# Patient Record
Sex: Female | Born: 1950 | Race: White | Hispanic: No | Marital: Married | Smoking: Never smoker
Health system: Southern US, Community
[De-identification: ages and names within clinical notes are randomized; demographics above are authoritative.]

## PROBLEM LIST (undated history)

## (undated) DIAGNOSIS — I10 Essential (primary) hypertension: Secondary | ICD-10-CM

---

## 1999-11-30 ENCOUNTER — Other Ambulatory Visit: Admission: RE | Admit: 1999-11-30 | Discharge: 1999-11-30 | Payer: Self-pay | Admitting: Obstetrics & Gynecology

## 2004-01-08 ENCOUNTER — Other Ambulatory Visit: Admission: RE | Admit: 2004-01-08 | Discharge: 2004-01-08 | Payer: Self-pay | Admitting: Obstetrics & Gynecology

## 2004-05-25 ENCOUNTER — Encounter: Admission: RE | Admit: 2004-05-25 | Discharge: 2004-05-25 | Payer: Self-pay | Admitting: Obstetrics & Gynecology

## 2012-04-30 ENCOUNTER — Emergency Department (HOSPITAL_COMMUNITY): Payer: BC Managed Care – PPO

## 2012-04-30 ENCOUNTER — Encounter (HOSPITAL_COMMUNITY): Payer: Self-pay

## 2012-04-30 ENCOUNTER — Emergency Department (HOSPITAL_COMMUNITY): Payer: BC Managed Care – PPO | Admitting: Anesthesiology

## 2012-04-30 ENCOUNTER — Encounter (HOSPITAL_COMMUNITY): Payer: Self-pay | Admitting: Anesthesiology

## 2012-04-30 ENCOUNTER — Encounter (HOSPITAL_COMMUNITY)
Admission: EM | Disposition: A | Discharge status: Home / Self Care | Payer: Self-pay | Source: Home / Self Care | Attending: Orthopedic Surgery

## 2012-04-30 ENCOUNTER — Ambulatory Visit (HOSPITAL_COMMUNITY)
Admission: EM | Admit: 2012-04-30 | Discharge: 2012-05-01 | Disposition: A | Discharge status: Home / Self Care | Payer: BC Managed Care – PPO | Attending: Orthopedic Surgery | Admitting: Orthopedic Surgery

## 2012-04-30 DIAGNOSIS — S82851A Displaced trimalleolar fracture of right lower leg, initial encounter for closed fracture: Secondary | ICD-10-CM

## 2012-04-30 DIAGNOSIS — S9306XA Dislocation of unspecified ankle joint, initial encounter: Secondary | ICD-10-CM | POA: Insufficient documentation

## 2012-04-30 DIAGNOSIS — R296 Repeated falls: Secondary | ICD-10-CM | POA: Insufficient documentation

## 2012-04-30 DIAGNOSIS — S82853A Displaced trimalleolar fracture of unspecified lower leg, initial encounter for closed fracture: Principal | ICD-10-CM | POA: Insufficient documentation

## 2012-04-30 DIAGNOSIS — I10 Essential (primary) hypertension: Secondary | ICD-10-CM | POA: Insufficient documentation

## 2012-04-30 HISTORY — PX: ORIF ANKLE FRACTURE: SHX5408

## 2012-04-30 HISTORY — DX: Essential (primary) hypertension: I10

## 2012-04-30 LAB — CBC WITH DIFFERENTIAL/PLATELET
Basophils Absolute: 0 10*3/uL (ref 0.0–0.1)
HCT: 42.8 % (ref 36.0–46.0)
MCH: 31.3 pg (ref 26.0–34.0)
MCHC: 35.3 g/dL (ref 30.0–36.0)
MCV: 88.8 fL (ref 78.0–100.0)
Monocytes Absolute: 0.4 10*3/uL (ref 0.1–1.0)
Monocytes Relative: 4 % (ref 3–12)
Neutro Abs: 8.6 10*3/uL — ABNORMAL HIGH (ref 1.7–7.7)
Neutrophils Relative %: 88 % — ABNORMAL HIGH (ref 43–77)
Platelets: 279 10*3/uL (ref 150–400)
RBC: 4.82 MIL/uL (ref 3.87–5.11)
RDW: 12.6 % (ref 11.5–15.5)

## 2012-04-30 LAB — BASIC METABOLIC PANEL
CO2: 30 mEq/L (ref 19–32)
GFR calc Af Amer: 87 mL/min — ABNORMAL LOW (ref >90)
GFR calc non Af Amer: 75 mL/min — ABNORMAL LOW (ref >90)

## 2012-04-30 SURGERY — OPEN REDUCTION INTERNAL FIXATION (ORIF) ANKLE FRACTURE
Anesthesia: General | Site: Ankle | Laterality: Right | Wound class: Clean

## 2012-04-30 MED ORDER — ONDANSETRON HCL 4 MG/2ML IJ SOLN
4.0000 mg | Freq: Once | INTRAMUSCULAR | Status: AC
  Administered 2012-04-30: 4 mg via INTRAVENOUS
  Filled 2012-04-30: qty 2

## 2012-04-30 MED ORDER — CEFAZOLIN SODIUM-DEXTROSE 2-3 GM-% IV SOLR
INTRAVENOUS | Status: AC
  Filled 2012-04-30: qty 50

## 2012-04-30 MED ORDER — MORPHINE SULFATE 4 MG/ML IJ SOLN
4.0000 mg | Freq: Once | INTRAMUSCULAR | Status: AC
  Administered 2012-04-30: 4 mg via INTRAVENOUS
  Filled 2012-04-30: qty 1

## 2012-04-30 MED ORDER — LACTATED RINGERS IV SOLN
INTRAVENOUS | Status: DC | PRN
  Administered 2012-04-30: 17:00:00 via INTRAVENOUS

## 2012-04-30 MED ORDER — ROCURONIUM BROMIDE 100 MG/10ML IV SOLN
INTRAVENOUS | Status: DC | PRN
  Administered 2012-04-30: 2 mg via INTRAVENOUS

## 2012-04-30 MED ORDER — ONDANSETRON HCL 4 MG PO TABS: 4.0000 mg | ORAL_TABLET | Freq: Four times a day (QID) | ORAL | Status: DC | PRN

## 2012-04-30 MED ORDER — EPHEDRINE SULFATE 50 MG/ML IJ SOLN
INTRAMUSCULAR | Status: DC | PRN
  Administered 2012-04-30: 5 mg via INTRAVENOUS

## 2012-04-30 MED ORDER — CEFAZOLIN SODIUM-DEXTROSE 2-3 GM-% IV SOLR
2.0000 g | Freq: Once | INTRAVENOUS | Status: AC
  Administered 2012-04-30: 2 g via INTRAVENOUS

## 2012-04-30 MED ORDER — SODIUM CHLORIDE 0.9 % IV SOLN
INTRAVENOUS | Status: DC
  Administered 2012-04-30: 21:00:00 via INTRAVENOUS

## 2012-04-30 MED ORDER — SUCCINYLCHOLINE CHLORIDE 20 MG/ML IJ SOLN
INTRAMUSCULAR | Status: DC | PRN
  Administered 2012-04-30: 100 mg via INTRAVENOUS

## 2012-04-30 MED ORDER — BUPIVACAINE HCL (PF) 0.5 % IJ SOLN
INTRAMUSCULAR | Status: AC
  Filled 2012-04-30: qty 30

## 2012-04-30 MED ORDER — PROPOFOL 10 MG/ML IV BOLUS
INTRAVENOUS | Status: DC | PRN
  Administered 2012-04-30: 40 mg via INTRAVENOUS
  Administered 2012-04-30: 20 mg via INTRAVENOUS
  Administered 2012-04-30: 130 mg via INTRAVENOUS

## 2012-04-30 MED ORDER — ACETAMINOPHEN 10 MG/ML IV SOLN
1000.0000 mg | Freq: Four times a day (QID) | INTRAVENOUS | Status: DC
  Administered 2012-04-30 – 2012-05-01 (×2): 1000 mg via INTRAVENOUS
  Filled 2012-04-30 (×3): qty 100

## 2012-04-30 MED ORDER — LIDOCAINE HCL (PF) 2 % IJ SOLN
INTRAMUSCULAR | Status: DC | PRN
  Administered 2012-04-30: 75 mg

## 2012-04-30 MED ORDER — ACETAMINOPHEN 10 MG/ML IV SOLN
INTRAVENOUS | Status: AC
  Filled 2012-04-30: qty 100

## 2012-04-30 MED ORDER — ACETAMINOPHEN 10 MG/ML IV SOLN
INTRAVENOUS | Status: DC | PRN
  Administered 2012-04-30: 1000 mg via INTRAVENOUS

## 2012-04-30 MED ORDER — ONDANSETRON HCL 4 MG/2ML IJ SOLN
4.0000 mg | Freq: Four times a day (QID) | INTRAMUSCULAR | Status: DC | PRN
  Administered 2012-04-30 – 2012-05-01 (×2): 4 mg via INTRAVENOUS
  Filled 2012-04-30 (×2): qty 2

## 2012-04-30 MED ORDER — ASPIRIN EC 325 MG PO TBEC: 325.0000 mg | DELAYED_RELEASE_TABLET | Freq: Every day | ORAL | Status: AC

## 2012-04-30 MED ORDER — ONDANSETRON HCL 4 MG/2ML IJ SOLN
INTRAMUSCULAR | Status: DC | PRN
  Administered 2012-04-30: 4 mg via INTRAVENOUS

## 2012-04-30 MED ORDER — FENTANYL CITRATE 0.05 MG/ML IJ SOLN: 25.0000 ug | INTRAMUSCULAR | Status: DC | PRN

## 2012-04-30 MED ORDER — CEFAZOLIN SODIUM 1-5 GM-% IV SOLN
1.0000 g | Freq: Four times a day (QID) | INTRAVENOUS | Status: AC
  Administered 2012-04-30 – 2012-05-01 (×3): 1 g via INTRAVENOUS
  Filled 2012-04-30 (×3): qty 50

## 2012-04-30 MED ORDER — OXYCODONE-ACETAMINOPHEN 5-325 MG PO TABS
1.0000 | ORAL_TABLET | ORAL | Status: DC | PRN
  Administered 2012-05-01: 1 via ORAL
  Filled 2012-04-30: qty 1

## 2012-04-30 MED ORDER — BUPIVACAINE HCL (PF) 0.5 % IJ SOLN
INTRAMUSCULAR | Status: DC | PRN
  Administered 2012-04-30: 30 mL

## 2012-04-30 MED ORDER — 0.9 % SODIUM CHLORIDE (POUR BTL) OPTIME
TOPICAL | Status: DC | PRN
  Administered 2012-04-30: 1000 mL

## 2012-04-30 MED ORDER — MIDAZOLAM HCL 5 MG/5ML IJ SOLN
INTRAMUSCULAR | Status: DC | PRN
  Administered 2012-04-30: 2 mg via INTRAVENOUS

## 2012-04-30 MED ORDER — HYDROMORPHONE HCL PF 1 MG/ML IJ SOLN
0.5000 mg | INTRAMUSCULAR | Status: DC | PRN
  Administered 2012-04-30 – 2012-05-01 (×2): 1 mg via INTRAVENOUS
  Filled 2012-04-30 (×2): qty 1

## 2012-04-30 MED ORDER — OXYCODONE-ACETAMINOPHEN 5-325 MG PO TABS: 1.0000 | ORAL_TABLET | Freq: Four times a day (QID) | ORAL | Status: AC | PRN

## 2012-04-30 MED ORDER — FENTANYL CITRATE 0.05 MG/ML IJ SOLN
INTRAMUSCULAR | Status: DC | PRN
  Administered 2012-04-30 (×2): 50 ug via INTRAVENOUS
  Administered 2012-04-30 (×2): 100 ug via INTRAVENOUS

## 2012-04-30 MED ORDER — HYDROCHLOROTHIAZIDE 25 MG PO TABS
25.0000 mg | ORAL_TABLET | Freq: Every day | ORAL | Status: DC
  Administered 2012-05-01: 25 mg via ORAL
  Filled 2012-04-30: qty 1

## 2012-04-30 MED ORDER — ASPIRIN EC 325 MG PO TBEC
325.0000 mg | DELAYED_RELEASE_TABLET | Freq: Every morning | ORAL | Status: DC
  Filled 2012-04-30: qty 1

## 2012-04-30 SURGICAL SUPPLY — 50 items
BAG SPEC THK2 15X12 ZIP CLS (MISCELLANEOUS) ×1
BAG ZIPLOCK 12X15 (MISCELLANEOUS) ×2 IMPLANT
BANDAGE ELASTIC 4 VELCRO ST LF (GAUZE/BANDAGES/DRESSINGS) ×1 IMPLANT
BANDAGE ELASTIC 6 VELCRO ST LF (GAUZE/BANDAGES/DRESSINGS) ×1 IMPLANT
BIT DRILL 2.5X2.75 QC CALB (BIT) ×1 IMPLANT
BIT DRILL 2.9 CANN QC NONSTRL (BIT) ×1 IMPLANT
BIT DRILL 3.5X5.5 QC CALB (BIT) ×1 IMPLANT
CLOTH BEACON ORANGE TIMEOUT ST (SAFETY) ×2 IMPLANT
COVER SURGICAL LIGHT HANDLE (MISCELLANEOUS) ×2 IMPLANT
CUFF TOURN SGL QUICK 34 (TOURNIQUET CUFF) ×2
CUFF TRNQT CYL 34X4X40X1 (TOURNIQUET CUFF) ×1 IMPLANT
DECANTER SPIKE VIAL GLASS SM (MISCELLANEOUS) ×2 IMPLANT
DRAPE C-ARM 42X72 X-RAY (DRAPES) ×2 IMPLANT
DRAPE U-SHAPE 47X51 STRL (DRAPES) ×2 IMPLANT
DRSG ADAPTIC 3X8 NADH LF (GAUZE/BANDAGES/DRESSINGS) ×2 IMPLANT
DRSG PAD ABDOMINAL 8X10 ST (GAUZE/BANDAGES/DRESSINGS) ×2 IMPLANT
ELECT REM PT RETURN 9FT ADLT (ELECTROSURGICAL) ×2
ELECTRODE REM PT RTRN 9FT ADLT (ELECTROSURGICAL) ×1 IMPLANT
GAUZE XEROFORM 5X9 LF (GAUZE/BANDAGES/DRESSINGS) ×1 IMPLANT
GLOVE BIO SURGEON STRL SZ8 (GLOVE) ×2 IMPLANT
GLOVE ECLIPSE 7.5 STRL STRAW (GLOVE) ×2 IMPLANT
K-WIRE ACE 1.6X6 (WIRE) ×2
KWIRE ACE 1.6X6 (WIRE) IMPLANT
MANIFOLD NEPTUNE II (INSTRUMENTS) ×2 IMPLANT
NEEDLE HYPO 22GX1.5 SAFETY (NEEDLE) ×2 IMPLANT
PACK LOWER EXTREMITY WL (CUSTOM PROCEDURE TRAY) ×2 IMPLANT
PAD CAST 4YDX4 CTTN HI CHSV (CAST SUPPLIES) ×2 IMPLANT
PADDING CAST COTTON 4X4 STRL (CAST SUPPLIES) ×2
PADDING CAST COTTON 6X4 STRL (CAST SUPPLIES) ×1 IMPLANT
PLATE ACE 100DEG 7HOLE (Plate) ×1 IMPLANT
POSITIONER SURGICAL ARM (MISCELLANEOUS) ×2 IMPLANT
SCREW ACE CAN 4.0 44M (Screw) ×1 IMPLANT
SCREW CANC LAG 4X45 (Screw) ×1 IMPLANT
SCREW CORTICAL 3.5MM  10MM (Screw) ×1 IMPLANT
SCREW CORTICAL 3.5MM  12MM (Screw) ×3 IMPLANT
SCREW CORTICAL 3.5MM 10MM (Screw) IMPLANT
SCREW CORTICAL 3.5MM 12MM (Screw) IMPLANT
SCREW CORTICAL 3.5MM 18MM (Screw) ×1 IMPLANT
SCREW NLOCK CANC HEX 4X12 (Screw) ×1 IMPLANT
SCREW NLOCK CANC HEX 4X14 (Screw) ×1 IMPLANT
SCREW NLOCK CANC HEX 4X16 (Screw) ×1 IMPLANT
SPLINT FIBERGLASS 5X30 (CAST SUPPLIES) ×2 IMPLANT
SPONGE GAUZE 4X4 12PLY (GAUZE/BANDAGES/DRESSINGS) ×2 IMPLANT
STAPLER VISISTAT 35W (STAPLE) ×1 IMPLANT
SUT ETHILON 4 0 PS 2 18 (SUTURE) ×4 IMPLANT
SUT VIC AB 2-0 CT1 27 (SUTURE) ×2
SUT VIC AB 2-0 CT1 TAPERPNT 27 (SUTURE) ×1 IMPLANT
SUT VIC AB 2-0 CT2 27 (SUTURE) ×2 IMPLANT
SUT VIC AB 4-0 PS2 18 (SUTURE) ×2 IMPLANT
SYR CONTROL 10ML LL (SYRINGE) ×2 IMPLANT

## 2012-04-30 NOTE — Transfer of Care (Deleted)
Immediate Anesthesia Transfer of Care Note  Patient: Kathleen Ball  Procedure(s) Performed: Procedure(s): OPEN REDUCTION INTERNAL FIXATION (ORIF) right  ANKLE FRACTURE (Right)  Patient Location: PACU  Anesthesia Type:General  Level of Consciousness: awake, oriented and patient cooperative  Airway & Oxygen Therapy: Patient Spontanous Breathing and Patient connected to face mask oxygen  Post-op Assessment: Report given to PACU RN, Post -op Vital signs reviewed and stable and Patient moving all extremities X 4  Post vital signs: stable  Complications: No apparent anesthesia complications 

## 2012-04-30 NOTE — Anesthesia Preprocedure Evaluation (Signed)
Anesthesia Evaluation  Patient identified by MRN, date of birth, ID band Patient awake    Reviewed: Allergy & Precautions, H&P , Patient's Chart, lab work & pertinent test results, reviewed documented beta blocker date and time   Airway Mallampati: II TM Distance: >3 FB Neck ROM: full    Dental no notable dental hx.    Pulmonary  breath sounds clear to auscultation  Pulmonary exam normal       Cardiovascular hypertension, On Medications Rhythm:regular Rate:Normal     Neuro/Psych    GI/Hepatic   Endo/Other    Renal/GU      Musculoskeletal   Abdominal   Peds  Hematology   Anesthesia Other Findings Just barely not NPO; Pt opts for GA-ETT Healthy and well otherwise  Reproductive/Obstetrics                           Anesthesia Physical Anesthesia Plan  ASA: II and emergent  Anesthesia Plan: General   Post-op Pain Management:    Induction: Intravenous and Cricoid pressure planned  Airway Management Planned: Oral ETT  Additional Equipment:   Intra-op Plan:   Post-operative Plan:   Informed Consent: I have reviewed the patients History and Physical, chart, labs and discussed the procedure including the risks, benefits and alternatives for the proposed anesthesia with the patient or authorized representative who has indicated his/her understanding and acceptance.   Dental Advisory Given and Dental advisory given  Plan Discussed with: CRNA and Surgeon  Anesthesia Plan Comments: (  Discussed  general anesthesia, including possible nausea, instrumentation of airway, sore throat,pulmonary aspiration, etc. I asked if the were any outstanding questions, or  concerns before we proceeded. )        Anesthesia Quick Evaluation

## 2012-04-30 NOTE — ED Notes (Signed)
Paaient reports that she was getting out of a car and her right ankle was caught up in her purse, causing her to fall. Patient went to an Urgent Care where an x-ray confirmed a fracture to the right ankle. Patient has the CD.

## 2012-04-30 NOTE — Transfer of Care (Signed)
Immediate Anesthesia Transfer of Care Note  Patient: Kathleen Ball  Procedure(s) Performed: Procedure(s): OPEN REDUCTION INTERNAL FIXATION (ORIF) right  ANKLE FRACTURE (Right)  Patient Location: PACU  Anesthesia Type:General  Level of Consciousness: awake, oriented and patient cooperative  Airway & Oxygen Therapy: Patient Spontanous Breathing and Patient connected to face mask oxygen  Post-op Assessment: Report given to PACU RN, Post -op Vital signs reviewed and stable and Patient moving all extremities X 4  Post vital signs: stable  Complications: No apparent anesthesia complications

## 2012-04-30 NOTE — ED Provider Notes (Signed)
Medical screening examination/treatment/procedure(s) were conducted as a shared visit with non-physician practitioner(s) and myself.  I personally evaluated the patient during the encounter  Pt presents with ankle injury/trimalleolar fracture.  Foot is dusky, but pulses are intact.  Kathleen Ball discussed with ortho who will take pt to surgery.  Rolan Bucco, MD 04/30/12 [phone]

## 2012-04-30 NOTE — Anesthesia Postprocedure Evaluation (Signed)
  Anesthesia Post Note  Patient: Kathleen Ball  Procedure(s) Performed: Procedure(s) (LRB): OPEN REDUCTION INTERNAL FIXATION (ORIF) right  ANKLE FRACTURE (Right)  Anesthesia type: GA  Patient location: PACU  Post pain: Pain level controlled  Post assessment: Post-op Vital signs reviewed  Last Vitals:  Filed Vitals:   04/30/12 2015  BP:   Pulse: 88  Temp:   Resp: 18    Post vital signs: Reviewed  Level of consciousness: sedated  Complications: No apparent anesthesia complications

## 2012-04-30 NOTE — H&P (Signed)
CC: r ankle pain  Kathleen Ball is an 62 y.o. female.  HPI: 62 yo female who fell today and suffered a tri-mal ankle fracture.  Pt noted to have dislocation and we are consulted for treatment  Past Medical History  Diagnosis Date  . Hypertension     History reviewed. No pertinent past surgical history.  Family History  Problem Relation Age of Onset  . Stroke Mother   . Hypertension Father     Social History:  reports that she has never smoked. She has never used smokeless tobacco. She reports that  drinks alcohol. She reports that she does not use illicit drugs.  Allergies: Allergies not on file  Medications: I have reviewed the patient's current medications.  Results for orders placed during the hospital encounter of 04/30/12 (from the past 48 hour(s))  CBC WITH DIFFERENTIAL     Status: Abnormal   Collection Time    04/30/12  3:35 PM      Result Value Range   WBC 9.9  4.0 - 10.5 K/uL   RBC 4.82  3.87 - 5.11 MIL/uL   Hemoglobin 15.1 (*) 12.0 - 15.0 g/dL   HCT 16.1  09.6 - 04.5 %   MCV 88.8  78.0 - 100.0 fL   MCH 31.3  26.0 - 34.0 pg   MCHC 35.3  30.0 - 36.0 g/dL   RDW 40.9  81.1 - 91.4 %   Platelets 279  150 - 400 K/uL   Neutrophils Relative 88 (*) 43 - 77 %   Neutro Abs 8.6 (*) 1.7 - 7.7 K/uL   Lymphocytes Relative 9 (*) 12 - 46 %   Lymphs Abs 0.9  0.7 - 4.0 K/uL   Monocytes Relative 4  3 - 12 %   Monocytes Absolute 0.4  0.1 - 1.0 K/uL   Eosinophils Relative 0  0 - 5 %   Eosinophils Absolute 0.0  0.0 - 0.7 K/uL   Basophils Relative 0  0 - 1 %   Basophils Absolute 0.0  0.0 - 0.1 K/uL  BASIC METABOLIC PANEL     Status: Abnormal   Collection Time    04/30/12  3:35 PM      Result Value Range   Sodium 138  135 - 145 mEq/L   Potassium 3.2 (*) 3.5 - 5.1 mEq/L   Chloride 98  96 - 112 mEq/L   CO2 30  19 - 32 mEq/L   Glucose, Bld 125 (*) 70 - 99 mg/dL   BUN 12  6 - 23 mg/dL   Creatinine, Ser 7.82  0.50 - 1.10 mg/dL   Calcium 9.6  8.4 - 95.6 mg/dL   GFR calc  non Af Amer 75 (*) >90 mL/min   GFR calc Af Amer 87 (*) >90 mL/min   Comment:            The eGFR has been calculated     using the CKD EPI equation.     This calculation has not been     validated in all clinical     situations.     eGFR's persistently     <90 mL/min signify     possible Chronic Kidney Disease.    Dg Ankle Complete Right  04/30/2012  *RADIOLOGY REPORT*  Clinical Data: Fall, right ankle pain/deformity  RIGHT ANKLE - COMPLETE 3+ VIEW  Comparison: None.  Findings: Distal fibular shaft fracture with posterior displacement.  Additional tiny avulsion fracture along the inferior aspect of the lateral malleolus.  Inferiorly displaced medial malleolar fracture.  Suspected mildly displaced posterior malleolar fracture.  Posterolateral talar shift.  Posterior soft tissue swelling.  IMPRESSION: Trimalleolar fracture-dislocation with associated posterolateral talar shift.   Original Report Authenticated By: Charline Bills, M.D.     ROS: I have reviewed the patient's review of systems thoroughly and there are no positive responses as relates to the HPI. EXAM  Blood pressure 142/87, pulse 99, temperature 99.2 F (37.3 C), temperature source Oral, resp. rate 16, SpO2 98.00%. Well-developed well-nourished patient in no acute distress. Alert and oriented x3 HEENT:within normal limits Cardiac: Regular rate and rhythm Pulmonary: Lungs clear to auscultation Abdomen: Soft and nontender.  Normal active bowel sounds  Musculoskeletal: r ankle +sts around ankle nvi distally  Assessment/Plan: 62 yo female with tri-mal fracture dislocation// pt needs urgent transport to OR for ORIF right ankle.  Pt aware of risks and benefits of surgery and wishes to proceed in face of risk of bleeding,infection, loss of reduction ,need for further surgery and slight risk of death around surgery and wishes to proceed.will ,  Lissete Maestas L 04/30/2012, 4:53 PM

## 2012-04-30 NOTE — ED Provider Notes (Signed)
History     CSN: [id number]  Arrival date & time 04/30/12  1431   First MD Initiated Contact with Patient 04/30/12 1520      Chief Complaint  Patient presents with  . Ankle Injury    fracture    (Consider location/radiation/quality/duration/timing/severity/associated sxs/prior treatment) HPI Comments: Pt states that she was getting out of her car and she got her foot caught and her purse:pt went to urgent care and was sent over here  Patient is a 62 y.o. female presenting with lower extremity injury. The history is provided by the patient. No language interpreter was used.  Ankle Injury This is a new problem. The current episode started today. The problem occurs constantly. The problem has been unchanged. The symptoms are aggravated by walking and bending. She has tried nothing for the symptoms.  Ankle Injury  The symptoms are aggravated by walking and bending. She has tried nothing for the symptoms.    Past Medical History  Diagnosis Date  . Hypertension     History reviewed. No pertinent past surgical history.  Family History  Problem Relation Age of Onset  . Stroke Mother   . Hypertension Father     History  Substance Use Topics  . Smoking status: Never Smoker   . Smokeless tobacco: Never Used  . Alcohol Use: Yes     Comment: socially    OB History   Grav Para Term Preterm Abortions TAB SAB Ect Mult Living                  Review of Systems  Constitutional: Negative.   Respiratory: Negative.   Cardiovascular: Negative.     Allergies  Review of patient's allergies indicates not on file.  Home Medications  No current outpatient prescriptions on file.  BP 142/87  Pulse 99  Temp(Src) 99.2 F (37.3 C) (Oral)  Resp 16  SpO2 98%  Physical Exam  Nursing note and vitals reviewed. Constitutional: She is oriented to person, place, and time. She appears well-developed and well-nourished.  HENT:  Head: Normocephalic and atraumatic.  Eyes: Conjunctivae  and EOM are normal.  Neck: Normal range of motion. Neck supple.  Cardiovascular: Normal rate and regular rhythm.   Pulmonary/Chest: Effort normal and breath sounds normal.  Musculoskeletal:  Pt has obvious deformity to the right ankle:pulses intact:foot is dusky  Neurological: She is alert and oriented to person, place, and time.  Skin:  Dusky in color  Psychiatric: She has a normal mood and affect.    ED Course  Procedures (including critical care time)  Labs Reviewed  CBC WITH DIFFERENTIAL - Abnormal; Notable for the following:    Hemoglobin 15.1 (*)    Neutrophils Relative 88 (*)    Neutro Abs 8.6 (*)    Lymphocytes Relative 9 (*)    All other components within normal limits  BASIC METABOLIC PANEL - Abnormal; Notable for the following:    Potassium 3.2 (*)    Glucose, Bld 125 (*)    GFR calc non Af Amer 75 (*)    GFR calc Af Amer 87 (*)    All other components within normal limits   Dg Ankle Complete Right  04/30/2012  *RADIOLOGY REPORT*  Clinical Data: Fall, right ankle pain/deformity  RIGHT ANKLE - COMPLETE 3+ VIEW  Comparison: None.  Findings: Distal fibular shaft fracture with posterior displacement.  Additional tiny avulsion fracture along the inferior aspect of the lateral malleolus.  Inferiorly displaced medial malleolar fracture.  Suspected mildly displaced  posterior malleolar fracture.  Posterolateral talar shift.  Posterior soft tissue swelling.  IMPRESSION: Trimalleolar fracture-dislocation with associated posterolateral talar shift.   Original Report Authenticated By: Charline Bills, M.D.      1. Trimalleolar fracture of ankle, closed, right, initial encounter       MDM  Spoke with Dr. Luiz Blare and pt it to go to the OR        Teressa Lower, NP 04/30/12 1644

## 2012-04-30 NOTE — Brief Op Note (Signed)
04/30/2012  7:50 PM  PATIENT:  Kathleen Ball  61 y.o. female  PRE-OPERATIVE DIAGNOSIS:  right ankle fracture  POST-OPERATIVE DIAGNOSIS:  Right ankle fracture  PROCEDURE:  Procedure(s): OPEN REDUCTION INTERNAL FIXATION (ORIF) ANKLE FRACTURE (Right)  SURGEON:  Surgeon(s) and Role:    * Harvie Junior, MD - Primary  PHYSICIAN ASSISTANT:   ASSISTANTS: bethune   ANESTHESIA:   general  EBL:     BLOOD ADMINISTERED:none  DRAINS: none   LOCAL MEDICATIONS USED:  NONE  SPECIMEN:  No Specimen  DISPOSITION OF SPECIMEN:  N/A  COUNTS:  YES  TOURNIQUET:   Total Tourniquet Time Documented: Thigh (Right) - 89 minutes Total: Thigh (Right) - 89 minutes   DICTATION: .Other Dictation: Dictation Number [id number]  PLAN OF CARE: Admit to inpatient   PATIENT DISPOSITION:  PACU - hemodynamically stable.   Delay start of Pharmacological VTE agent (>24hrs) due to surgical blood loss or risk of bleeding: no

## 2012-05-01 ENCOUNTER — Encounter (HOSPITAL_COMMUNITY): Payer: Self-pay | Admitting: Orthopedic Surgery

## 2012-05-01 NOTE — Progress Notes (Signed)
Subjective: 1 Day Post-Op Procedure(s) (LRB): OPEN REDUCTION INTERNAL FIXATION (ORIF) right  ANKLE FRACTURE (Right) Patient reports pain as mild.    Objective: Vital signs in last 24 hours: Temp:  [98.3 F (36.8 C)-99.2 F (37.3 C)] 98.8 F (37.1 C) (04/14 0500) Pulse Rate:  [81-100] 92 (04/14 0500) Resp:  [14-18] 16 (04/14 0500) BP: (110-142)/(60-87) 111/60 mmHg (04/14 0500) SpO2:  [98 %-100 %] 99 % (04/14 0500) Weight:  [61.236 kg (135 lb)] 61.236 kg (135 lb) (04/13 1754)  Intake/Output from previous day: 04/13 0701 - 04/14 0700 In: 2230 [P.O.:240; I.V.:1690; IV Piggyback:300] Out: 400 [Urine:250; Emesis/NG output:100; Blood:50] Intake/Output this shift:     Recent Labs  04/30/12 1535  HGB 15.1*    Recent Labs  04/30/12 1535  WBC 9.9  RBC 4.82  HCT 42.8  PLT 279    Recent Labs  04/30/12 1535  NA 138  K 3.2*  CL 98  CO2 30  BUN 12  CREATININE 0.83  GLUCOSE 125*  CALCIUM 9.6   No results found for this basename: LABPT, INR,  in the last 72 hours  Neurologically intact ABD soft Neurovascular intact Sensation intact distally Intact pulses distally No cellulitis present Compartment soft  Assessment/Plan: 1 Day Post-Op Procedure(s) (LRB): OPEN REDUCTION INTERNAL FIXATION (ORIF) right  ANKLE FRACTURE (Right) Advance diet Up with therapy Discharge home with home health  Kathleen Ball L 05/01/2012, 8:17 AM

## 2012-05-01 NOTE — Evaluation (Signed)
Physical Therapy Evaluation Patient Details Name: Kathleen Ball. Mccosh MRN: [id number] DOB: 1950-05-31 Today's Date: 05/01/2012 Time: 4098-1191 PT Time Calculation (min): 15 min  PT Assessment / Plan / Recommendation Clinical Impression  Pt. is 62 yo female admitted 04/30/12 after twisting and sustaining trimallealr anklefx. S/p ORIF 4/13/ PM. Pt. has DME borroqwed. Pt. tolerated ambulation well, Will need to practice 2 steps after rest. Pt/ will benefit from PT while in acute care.Marland Kitchen    PT Assessment  Patient needs continued PT services    Follow Up Recommendations  Home health PT    Does the patient have the potential to tolerate intense rehabilitation      Barriers to Discharge        Equipment Recommendations  None recommended by PT    Recommendations for Other Services     Frequency 7X/week    Precautions / Restrictions Restrictions Weight Bearing Restrictions: Yes RLE Weight Bearing: Non weight bearing   Pertinent Vitals/Pain Reports feels swollen, encouraged elevation.      Mobility  Bed Mobility Bed Mobility: Supine to Sit;Sit to Supine Supine to Sit: HOB flat;7: Independent Sit to Supine: HOB flat;7: Independent Transfers Transfers: Sit to Stand;Stand to Sit Sit to Stand: 4: Min guard Stand to Sit: 6: Modified independent (Device/Increase time) Ambulation/Gait Ambulation/Gait Assistance: 4: Min guard Ambulation Distance (Feet): 50 Feet Assistive device: Rolling walker Ambulation/Gait Assistance Details: pt. maintained NWB very well.    Exercises     PT Diagnosis: Difficulty walking  PT Problem List: Decreased activity tolerance;Decreased mobility PT Treatment Interventions: DME instruction;Gait training;Stair training;Functional mobility training;Therapeutic activities;Patient/family education   PT Goals Acute Rehab PT Goals PT Goal Formulation: With patient/family Time For Goal Achievement: 05/03/12 Potential to Achieve Goals: Good Pt will  Ambulate: 16 - 50 feet;with supervision;with least restrictive assistive device PT Goal: Ambulate - Progress: Goal set today Pt will Go Up / Down Stairs: 1-2 stairs;with min assist;with least restrictive assistive device PT Goal: Up/Down Stairs - Progress: Goal set today  Visit Information  Last PT Received On: 05/01/12 Assistance Needed: +1    Subjective Data  Subjective: I am ready. I did ok yesterday. Patient Stated Goal: to go home.   Prior Functioning  Home Living Lives With: Spouse Available Help at Discharge: Family Type of Home: House Home Access: Stairs to enter Secretary/administrator of Steps: 1 ( x2) Home Layout: One level Bathroom Shower/Tub: Health visitor: Standard Home Adaptive Equipment: Bedside commode/3-in-1;Tub transfer bench;Walker - rolling;Crutches Prior Function Level of Independence: Independent Able to Take Stairs?: Yes Driving: Yes Vocation: Full time employment Communication Communication: No difficulties    Cognition  Cognition Overall Cognitive Status: Appears within functional limits for tasks assessed/performed    Extremity/Trunk Assessment Right Upper Extremity Assessment RUE ROM/Strength/Tone: Within functional levels Left Upper Extremity Assessment LUE ROM/Strength/Tone: Within functional levels Right Lower Extremity Assessment RLE ROM/Strength/Tone: Deficits RLE ROM/Strength/Tone Deficits: able to lift leg on/off of bed. Left Lower Extremity Assessment LLE ROM/Strength/Tone: Within functional levels   Balance Balance Balance Assessed: Yes Static Standing Balance Static Standing - Balance Support: Bilateral upper extremity supported Static Standing - Level of Assistance: 5: Stand by assistance Static Standing - Comment/# of Minutes: at RW.  End of Session PT - End of Session Equipment Utilized During Treatment: Gait belt (L shoe) Activity Tolerance: Patient tolerated treatment well Patient left: in bed;with call  bell/phone within reach;with family/visitor present Nurse Communication: Mobility status (nausea med.)  GP Functional Assessment Tool Used: clinical judgement. Functional Limitation: Mobility: Walking  and moving around Mobility: Walking and Moving Around Current Status [phone]): At least 1 percent but less than 20 percent impaired, limited or restricted Mobility: Walking and Moving Around Goal Status [phone]): 0 percent impaired, limited or restricted   Rada Hay 05/01/2012, 10:52 AM  Blanchard Kelch PT [phone]

## 2012-05-01 NOTE — Op Note (Signed)
NAMESHAMIRAH, IVAN NO.:  [id number]  MEDICAL RECORD NO.:  [id number]  LOCATION:  1523                         FACILITY:  [facility]  PHYSICIAN:  Harvie Junior, M.D.   DATE OF BIRTH:  1950/06/03  DATE OF PROCEDURE:  04/30/2012 DATE OF DISCHARGE:                              OPERATIVE REPORT   PREOPERATIVE DIAGNOSES: 1. Trimalleolar ankle fracture. 2. Ankle dislocation.  POSTOPERATIVE DIAGNOSES: 1. Trimalleolar ankle fracture. 2. Ankle dislocation.  PROCEDURE: 1. Manipulative closed reduction of ankle dislocation. 2. Open reduction and internal fixation of trimalleolar ankle fracture     with small fragment hardware.  SURGEON:  Harvie Junior, M.D.  ASSISTANT:  Marshia Ly, PA.  ANESTHESIA:  General.  BRIEF HISTORY:  Ms. Mackel is a 62 year old female who slipped and fell today.  She suffered a trimalleolar fracture dislocation.  We were operating when they showed [place] the x-rays.  At that point, she showed to have a significant dislocation.  We have changed the order of our urgent cases to put her in an emergent position, and she was brought to the operating room as soon as possible for open reduction and internal fixation, and manipulative closed reduction of her dislocation to take pressure off the skin.  DESCRIPTION OF PROCEDURE:  The patient was taken to the operating room. After adequate anesthesia was obtained with general anesthetic, the patient was placed supine on the operating table.  She moved into the operating table supine with a bump under the hip.  At this point, as soon as anesthesia had been administered, a manipulative closed reduction was undertaken to take pressure off the medial side of the skin.  Once this was done, the attention was turned towards the leg which was exsanguinated and a blood pressure currently inflated to 300 mmHg.  Following this, an incision was made over the lateral aspect of the leg, subcutaneous tissue  down the level of the fibula.  A manipulative closed reduction was undertaken, clamp put in place.  Once this was done, attention was turned to the medial side where the medial side was exposed and a manipulative closed reduction was undertaken of the medial malleolus and two screws were put in place through the medial malleolus.  Once this was completed, attention was turned towards back to the lateral side where a manipulative closed reduction was undertaken.  We got an anatomic reduction.  We placed an interfragmentary screw, and then a 7-hole plate which gave excellent fixation of the fracture.  Once that was completed, we took some images. There still appeared to be a little bit of posterior malleolus piece, it was certainly less than one third of the joint line, but we did go dissecting around posterolaterally, the piece was not there. Posteromedially, we found the piece, difficult time getting it down reduced.  We thought it could have been some interposition.  We removed all of that interposition, and we were still not able to get the piece down adequately to think about pinning it.  So, at that point, we felt that __________ go over third of the joint space, we felt that alleviating would be appropriate.  So, at this point, the wound was copiously irrigated  and suctioned dry.  The medial side was closed with 4-0 Vicryl and staples, lateral site closed with 2-0 Vicryl and staples. Sterile compressive dressing was applied as well as a U and a posterior splint, and the patient was taken to recovery, was noted to be in satisfactory condition.  Multiple fluoroscopic intraoperative images were taken, interpreted by me.  Estimated blood loss for the procedure was minimal.     Harvie Junior, M.D.     Ranae Plumber  D:  04/30/2012  T:  05/01/2012  Job:  [id number]

## 2012-05-01 NOTE — Progress Notes (Signed)
Physical Therapy Treatment Patient Details Name: Kathleen Ball. Loja MRN: [id number] DOB: [birth year] Today's Date: 05/01/2012 Time: [id number] PT Time Calculation (min): 14 min  PT Assessment / Plan / Recommendation Comments on Treatment Session  pt will use RW until [facility] PT sees pt fro crutches or knee walker.    Follow Up Recommendations  Home health PT     Does the patient have the potential to tolerate intense rehabilitation     Barriers to Discharge        Equipment Recommendations  None recommended by PT    Recommendations for Other Services    Frequency 7X/week   Plan Discharge plan remains appropriate    Precautions / Restrictions Restrictions Weight Bearing Restrictions: Yes RLE Weight Bearing: Non weight bearing   Pertinent Vitals/Pain 4/10 r ankle.    Mobility  Bed Mobility Bed Mobility: Supine to Sit;Sit to Supine Supine to Sit: HOB flat;7: Independent Sit to Supine: HOB flat;7: Independent Transfers Transfers: Sit to Stand;Stand to Sit Sit to Stand: 5: Supervision;From bed;From chair/3-in-1 Stand to Sit: 6: Modified independent (Device/Increase time) Ambulation/Gait Ambulation/Gait Assistance: 4: Min guard Ambulation Distance (Feet): 20 Feet Assistive device: Rolling walker Ambulation/Gait Assistance Details: pt. maintained NWB very well. Gait Pattern: Step-to pattern Stairs: Yes Stairs Assistance: 3: Mod assist;4: Min assist Stairs Assistance Details (indicate cue type and reason): instruction to spouse/pt. with 2 practices. Stair Management Technique: No rails;Backwards;With walker Number of Stairs: 1    Exercises     PT Diagnosis: Difficulty walking  PT Problem List: Decreased activity tolerance;Decreased mobility PT Treatment Interventions: DME instruction;Gait training;Stair training;Functional mobility training;Therapeutic activities;Patient/family education   PT Goals Acute Rehab PT Goals PT Goal Formulation: With patient/family Time For  Goal Achievement: 05/03/12 Potential to Achieve Goals: Good Pt will Ambulate: 16 - 50 feet;with supervision;with least restrictive assistive device PT Goal: Ambulate - Progress: Progressing toward goal Pt will Go Up / Down Stairs: 1-2 stairs;with min assist;with least restrictive assistive device PT Goal: Up/Down Stairs - Progress: Met  Visit Information  Last PT Received On: 05/01/12 Assistance Needed: +1    Subjective Data  Subjective: I feel better, It hurts some. Patient Stated Goal: to go home.   Cognition  Cognition Overall Cognitive Status: Appears within functional limits for tasks assessed/performed    Balance  Balance Balance Assessed: Yes Static Standing Balance Static Standing - Balance Support: Bilateral upper extremity supported Static Standing - Level of Assistance: 5: Stand by assistance Static Standing - Comment/# of Minutes: at RW.  End of Session PT - End of Session Equipment Utilized During Treatment: Gait belt (L shoe) Activity Tolerance: Patient tolerated treatment well Patient left: in bed Nurse Communication: Mobility status   GP Functional Assessment Tool Used: clinical judgement. Functional Limitation: Mobility: Walking and moving around Mobility: Walking and Moving Around Current Status [phone]): At least 1 percent but less than 20 percent impaired, limited or restricted Mobility: Walking and Moving Around Goal Status [phone]): 0 percent impaired, limited or restricted Mobility: Walking and Moving Around Discharge Status [phone]): At least 1 percent but less than 20 percent impaired, limited or restricted   Rada Hay 05/01/2012, 2:23 PM

## 2012-05-01 NOTE — Care Management Note (Signed)
    Page 1 of 1   05/01/2012     2:32:28 PM   CARE MANAGEMENT NOTE 05/01/2012  Patient:  Kathleen Ball,Kathleen L.   Account Number:  [id number]  Date Initiated:  05/01/2012  Documentation initiated by:  Britteney Ayotte  Subjective/Objective Assessment:   62 yo female admitted s/p ORIF of ankle.     Action/Plan:   Home when stable   Anticipated DC Date:  05/01/2012   Anticipated DC Plan:  HOME W HOME HEALTH SERVICES      DC Planning Services  CM consult      [facility] Choice  HOME HEALTH   Choice offered to / List presented to:  C-1 Patient        HH arranged  HH-2 PT      [facility] agency  [facility]   Status of service:  Completed, signed off Medicare Important Message given?   (If response is "NO", the following Medicare IM given date fields will be blank) Date Medicare IM given:   Date Additional Medicare IM given:    Discharge Disposition:  HOME/SELF CARE  Per UR Regulation:  Reviewed for med. necessity/level of care/duration of stay  If discussed at Long Length of Stay Meetings, dates discussed:    Comments:

## 2014-06-20 IMAGING — CR DG CHEST 2V
2 series · 2 of 2 positions shown · non-contrast
Comparison: PA and lateral chest 12/01/2009.

CLINICAL DATA: Preoperative respiratory films for patient with an
ankle fracture.

CHEST - 2 VIEW

[w chest lat]
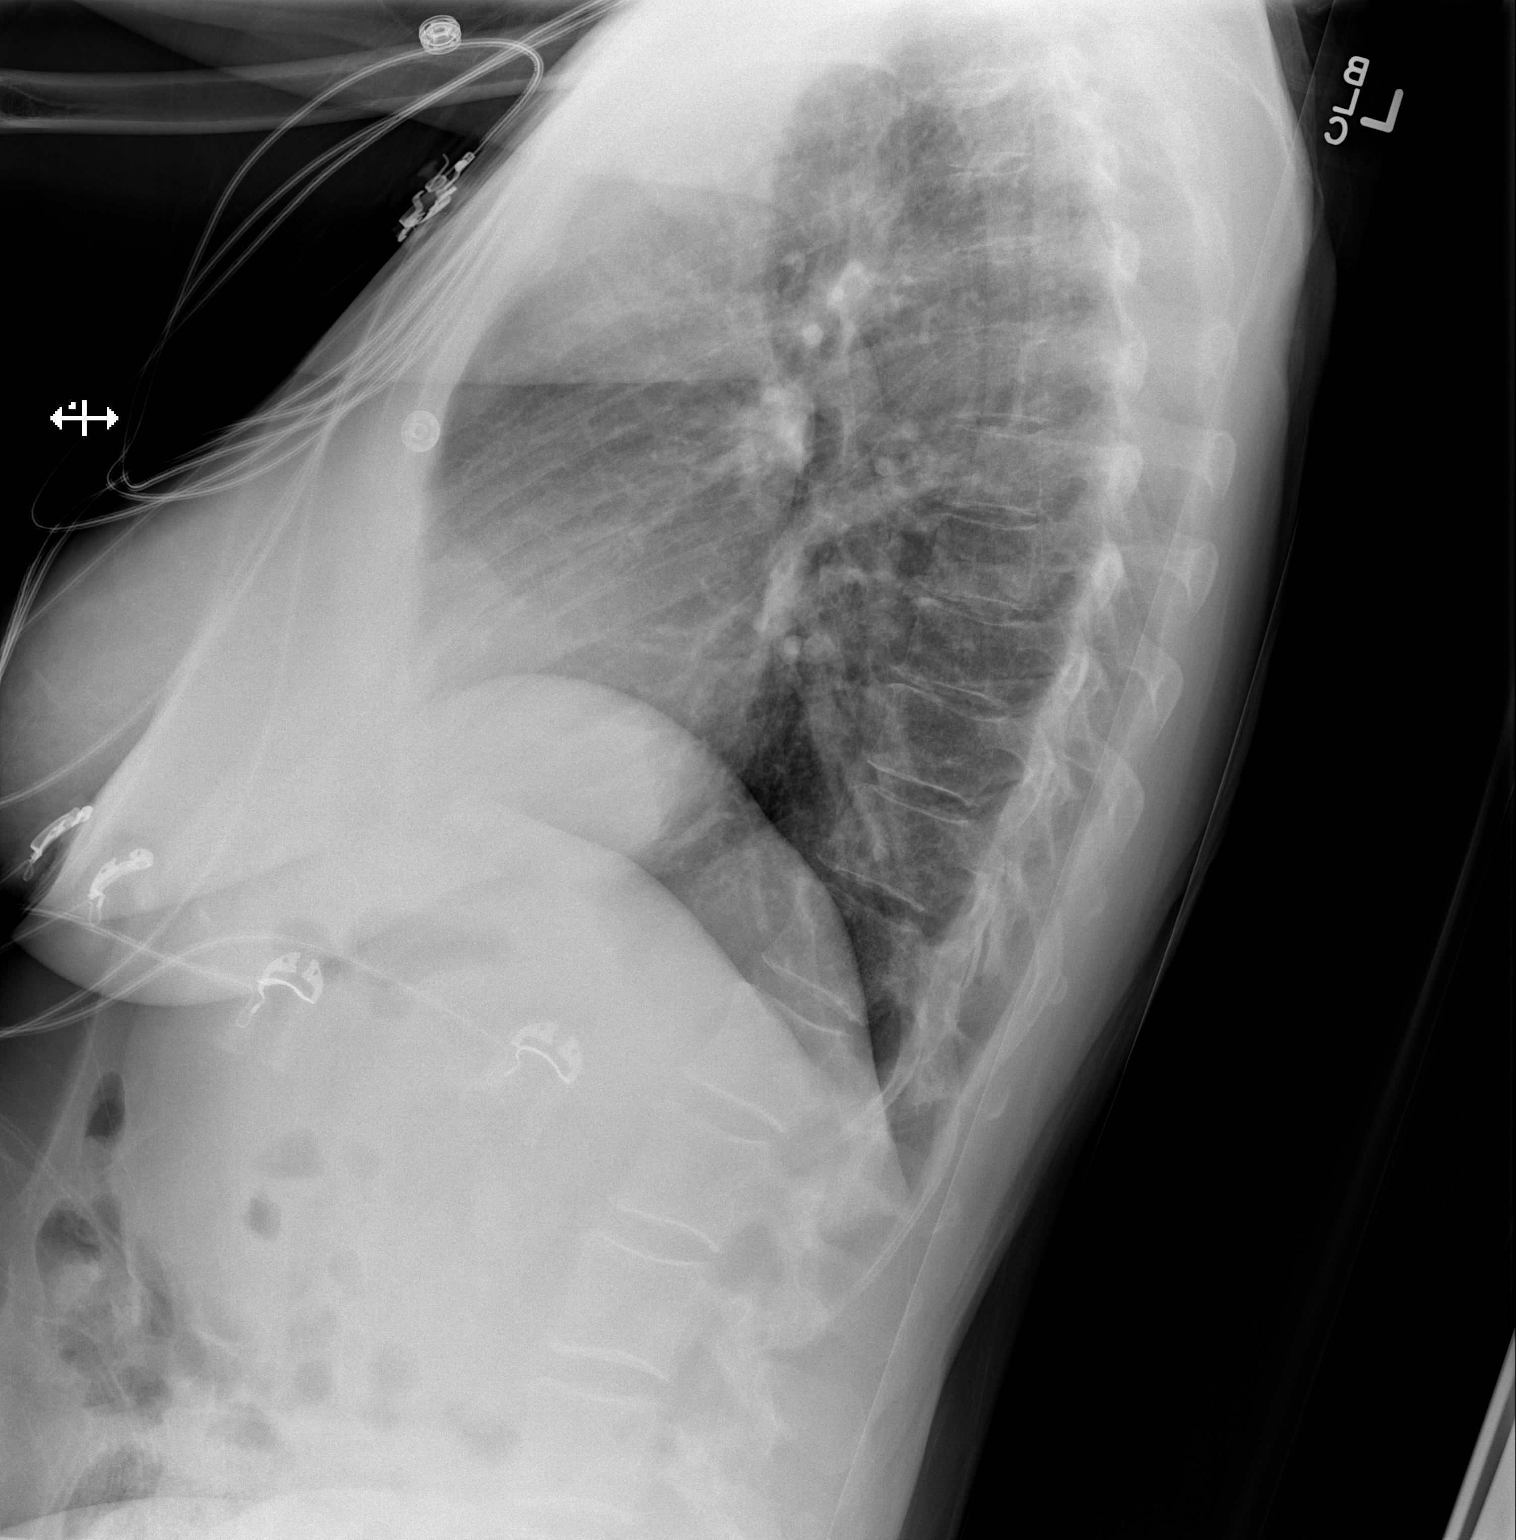

[x chest ap]
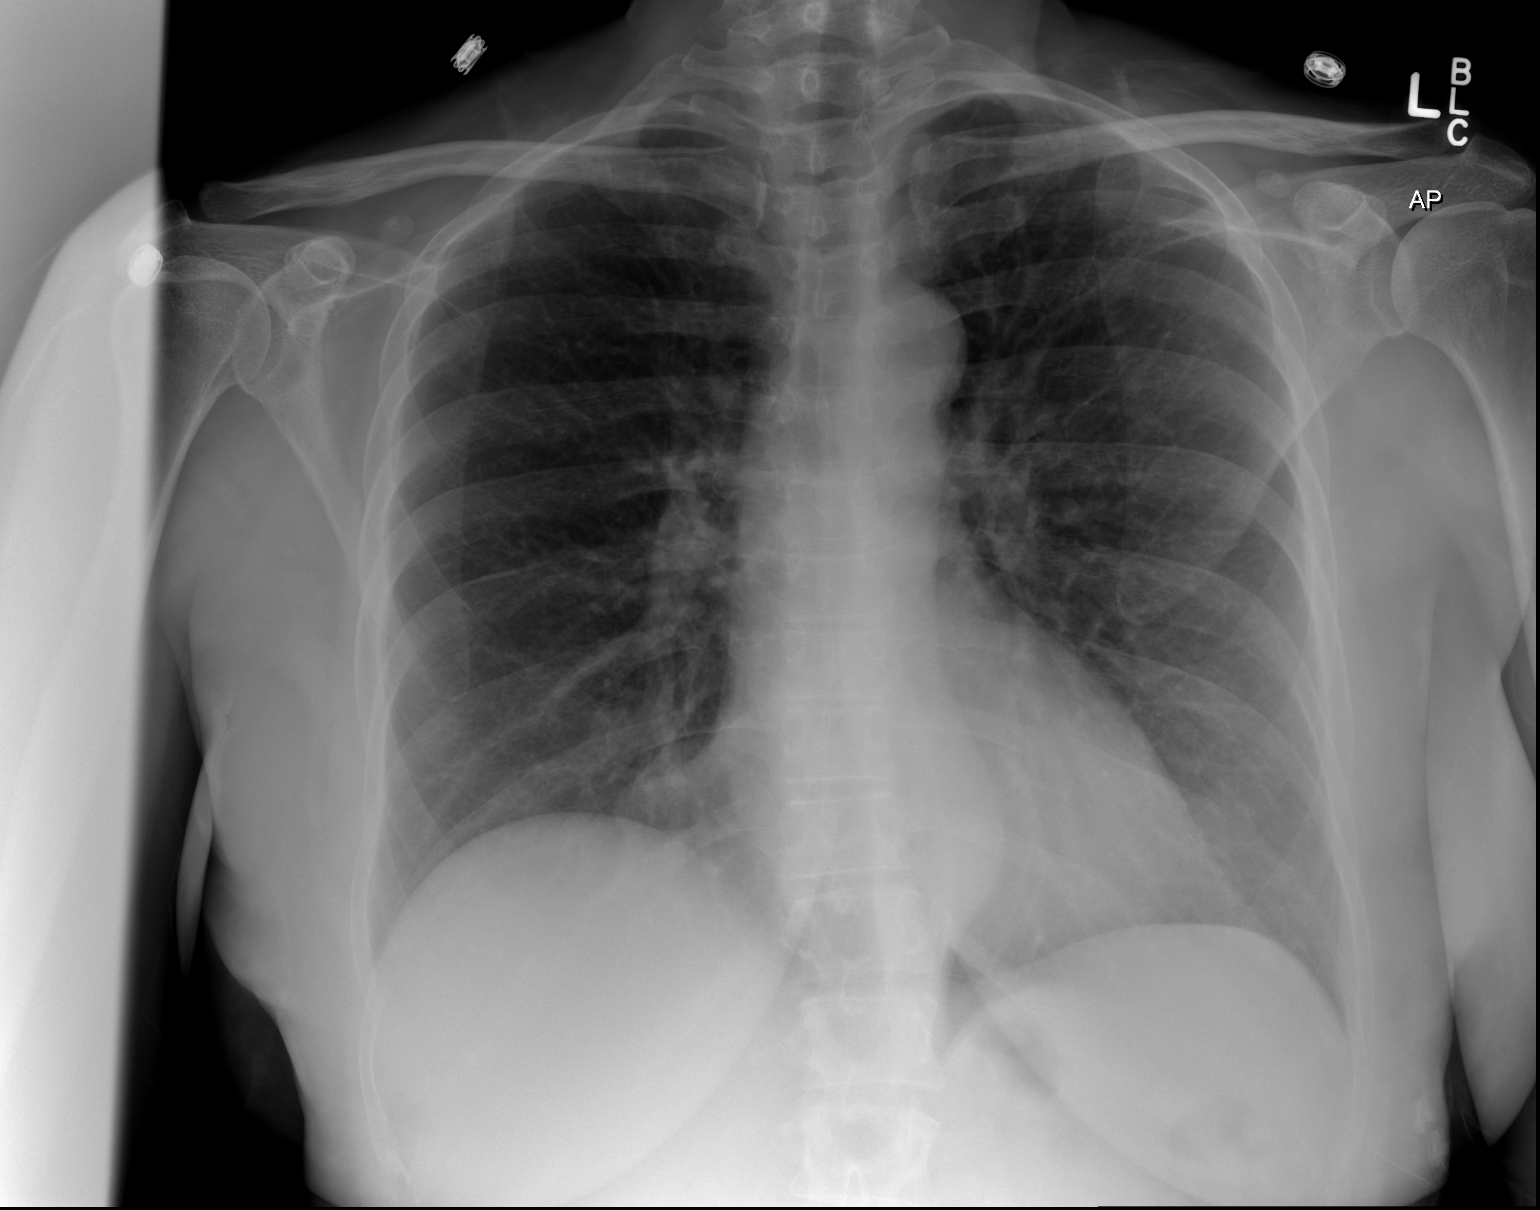

[2 of 2 positions shown; findings below may reference images not displayed]

FINDINGS: Lungs are clear.  Heart size is normal.  No pneumothorax
pleural effusion.
IMPRESSION: No acute disease.

## 2014-06-20 IMAGING — CR DG ANKLE COMPLETE 3+V*R*
3 series · 3 of 3 positions shown · non-contrast
Comparison: None.

CLINICAL DATA: Fall, right ankle pain/deformity

RIGHT ANKLE - COMPLETE 3+ VIEW

[x ankle lat right]
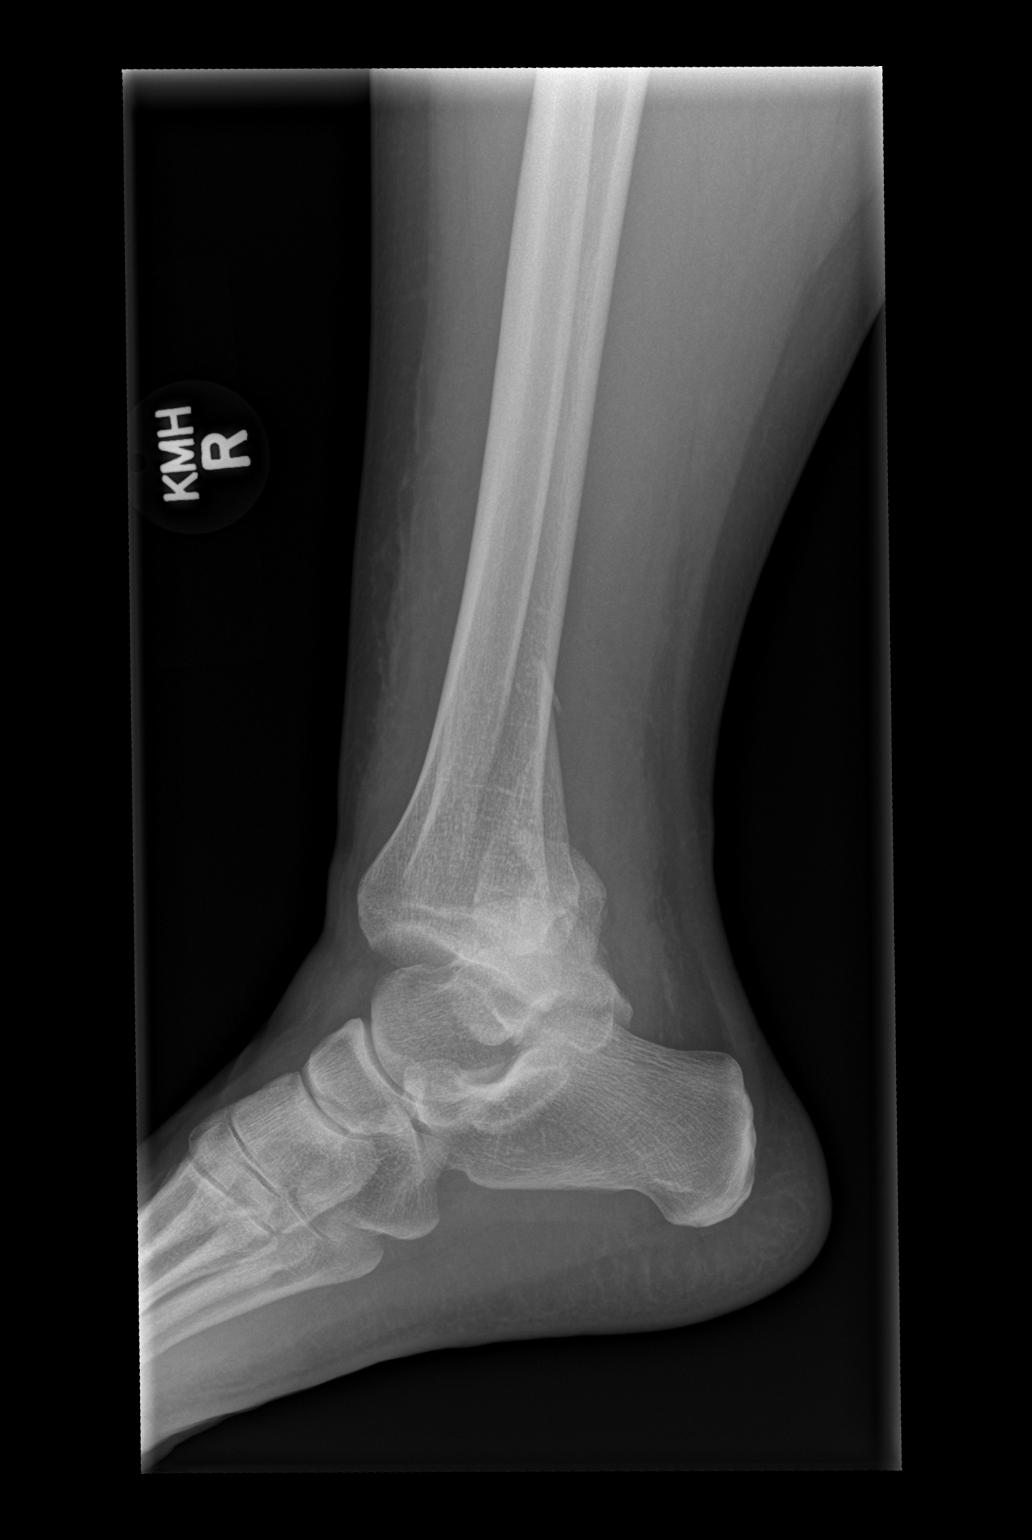

[x ankle ap right]
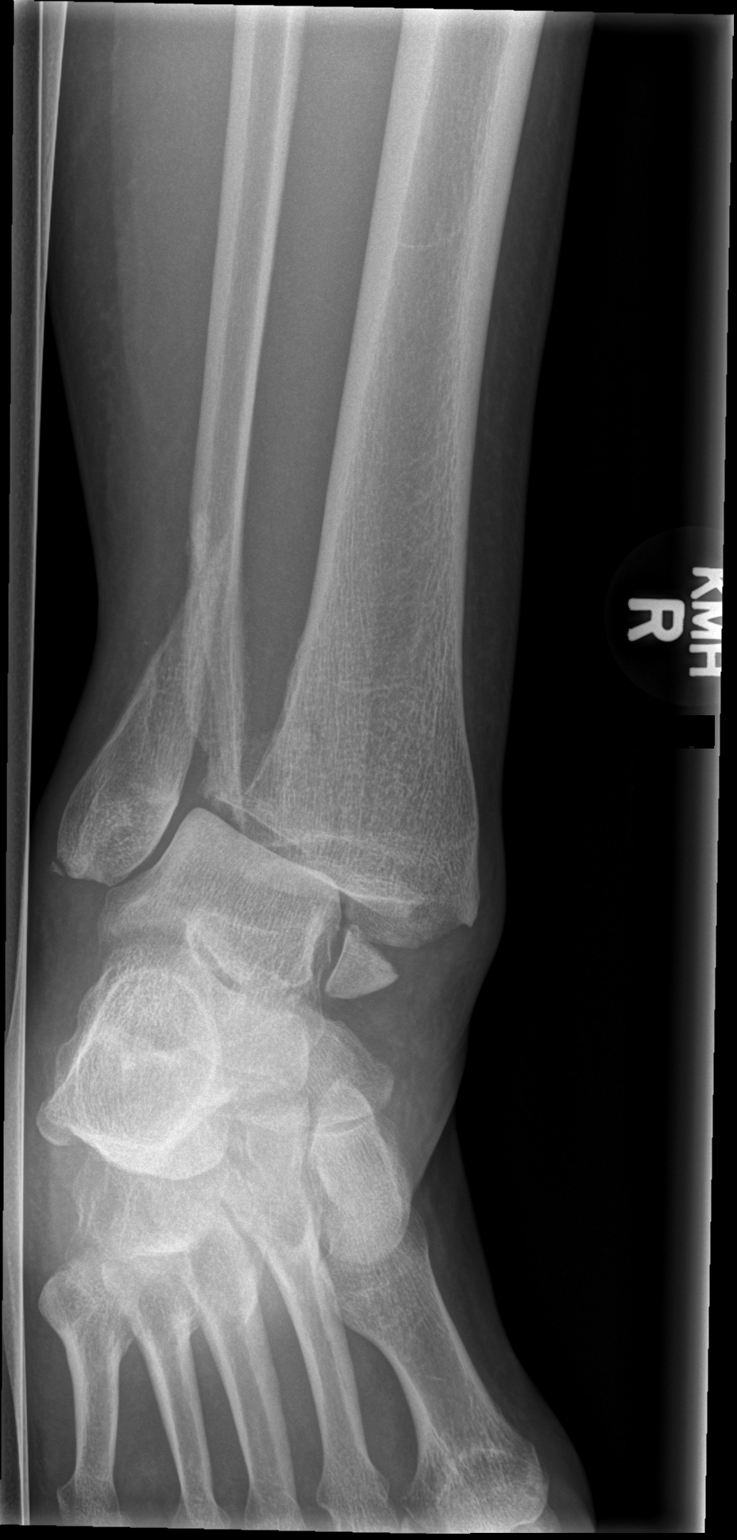

[x ankle right 0-3yrs]
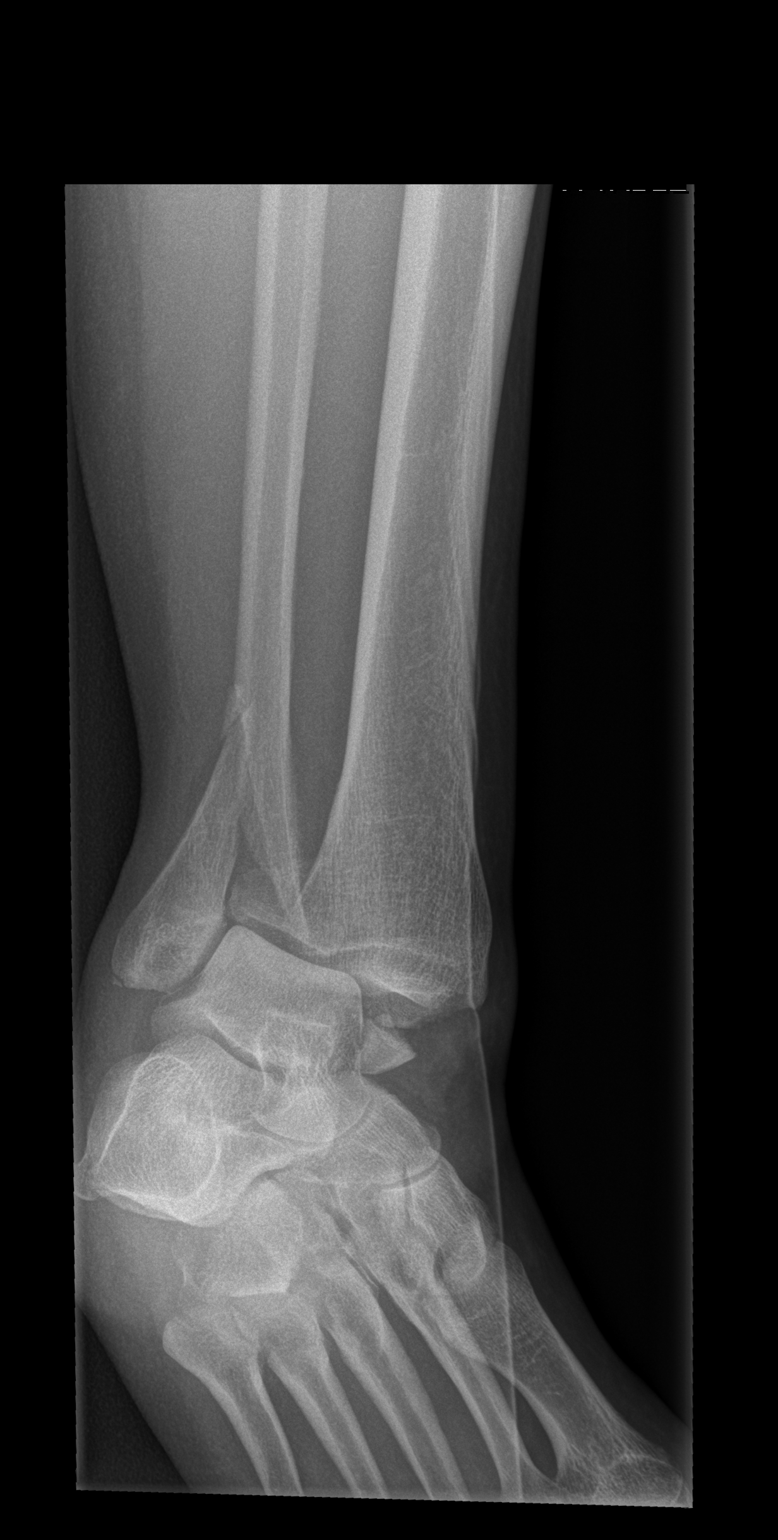

[3 of 3 positions shown; findings below may reference images not displayed]

FINDINGS: Distal fibular shaft fracture with posterior
displacement.  Additional tiny avulsion fracture along the inferior
aspect of the lateral malleolus.

Inferiorly displaced medial malleolar fracture.

Suspected mildly displaced posterior malleolar fracture.

Posterolateral talar shift.

Posterior soft tissue swelling.
IMPRESSION: Trimalleolar fracture-dislocation with associated posterolateral
talar shift.

## 2017-02-01 DIAGNOSIS — Z13220 Encounter for screening for lipoid disorders: Secondary | ICD-10-CM | POA: Diagnosis not present

## 2017-02-01 DIAGNOSIS — Z139 Encounter for screening, unspecified: Secondary | ICD-10-CM | POA: Diagnosis not present

## 2017-02-01 DIAGNOSIS — Z0000 Encounter for general adult medical examination without abnormal findings: Secondary | ICD-10-CM | POA: Diagnosis not present

## 2017-02-01 DIAGNOSIS — Z114 Encounter for screening for human immunodeficiency virus [HIV]: Secondary | ICD-10-CM | POA: Diagnosis not present

## 2017-02-01 DIAGNOSIS — Z1329 Encounter for screening for other suspected endocrine disorder: Secondary | ICD-10-CM | POA: Diagnosis not present

## 2017-02-08 DIAGNOSIS — Z6824 Body mass index (BMI) 24.0-24.9, adult: Secondary | ICD-10-CM | POA: Diagnosis not present

## 2017-02-08 DIAGNOSIS — J309 Allergic rhinitis, unspecified: Secondary | ICD-10-CM | POA: Diagnosis not present

## 2017-02-08 DIAGNOSIS — E782 Mixed hyperlipidemia: Secondary | ICD-10-CM | POA: Diagnosis not present

## 2017-02-08 DIAGNOSIS — I10 Essential (primary) hypertension: Secondary | ICD-10-CM | POA: Diagnosis not present

## 2017-04-12 DIAGNOSIS — J309 Allergic rhinitis, unspecified: Secondary | ICD-10-CM | POA: Diagnosis not present

## 2017-04-12 DIAGNOSIS — I10 Essential (primary) hypertension: Secondary | ICD-10-CM | POA: Diagnosis not present

## 2017-04-12 DIAGNOSIS — Z6825 Body mass index (BMI) 25.0-25.9, adult: Secondary | ICD-10-CM | POA: Diagnosis not present

## 2017-04-12 DIAGNOSIS — E782 Mixed hyperlipidemia: Secondary | ICD-10-CM | POA: Diagnosis not present

## 2017-06-21 DIAGNOSIS — Z0100 Encounter for examination of eyes and vision without abnormal findings: Secondary | ICD-10-CM | POA: Diagnosis not present

## 2017-06-21 DIAGNOSIS — H5200 Hypermetropia, unspecified eye: Secondary | ICD-10-CM | POA: Diagnosis not present

## 2017-06-21 DIAGNOSIS — I10 Essential (primary) hypertension: Secondary | ICD-10-CM | POA: Diagnosis not present

## 2017-06-21 DIAGNOSIS — E7800 Pure hypercholesterolemia, unspecified: Secondary | ICD-10-CM | POA: Diagnosis not present

## 2017-08-09 DIAGNOSIS — E782 Mixed hyperlipidemia: Secondary | ICD-10-CM | POA: Diagnosis not present

## 2017-08-09 DIAGNOSIS — Z23 Encounter for immunization: Secondary | ICD-10-CM | POA: Diagnosis not present

## 2017-08-09 DIAGNOSIS — Z6824 Body mass index (BMI) 24.0-24.9, adult: Secondary | ICD-10-CM | POA: Diagnosis not present

## 2017-08-09 DIAGNOSIS — I10 Essential (primary) hypertension: Secondary | ICD-10-CM | POA: Diagnosis not present

## 2017-11-22 DIAGNOSIS — Z0000 Encounter for general adult medical examination without abnormal findings: Secondary | ICD-10-CM | POA: Diagnosis not present

## 2017-11-22 DIAGNOSIS — Z1329 Encounter for screening for other suspected endocrine disorder: Secondary | ICD-10-CM | POA: Diagnosis not present

## 2017-11-22 DIAGNOSIS — Z114 Encounter for screening for human immunodeficiency virus [HIV]: Secondary | ICD-10-CM | POA: Diagnosis not present

## 2017-11-22 DIAGNOSIS — Z13220 Encounter for screening for lipoid disorders: Secondary | ICD-10-CM | POA: Diagnosis not present

## 2017-12-05 DIAGNOSIS — Z6824 Body mass index (BMI) 24.0-24.9, adult: Secondary | ICD-10-CM | POA: Diagnosis not present

## 2017-12-05 DIAGNOSIS — Z0000 Encounter for general adult medical examination without abnormal findings: Secondary | ICD-10-CM | POA: Diagnosis not present

## 2017-12-05 DIAGNOSIS — Z23 Encounter for immunization: Secondary | ICD-10-CM | POA: Diagnosis not present

## 2018-02-01 DIAGNOSIS — E782 Mixed hyperlipidemia: Secondary | ICD-10-CM | POA: Diagnosis not present

## 2018-02-01 DIAGNOSIS — I10 Essential (primary) hypertension: Secondary | ICD-10-CM | POA: Diagnosis not present

## 2018-02-01 DIAGNOSIS — Z6825 Body mass index (BMI) 25.0-25.9, adult: Secondary | ICD-10-CM | POA: Diagnosis not present

## 2018-02-01 DIAGNOSIS — J309 Allergic rhinitis, unspecified: Secondary | ICD-10-CM | POA: Diagnosis not present

## 2018-07-13 DIAGNOSIS — Z23 Encounter for immunization: Secondary | ICD-10-CM | POA: Diagnosis not present

## 2018-09-27 DIAGNOSIS — Z23 Encounter for immunization: Secondary | ICD-10-CM | POA: Diagnosis not present

## 2018-12-05 DIAGNOSIS — Z0000 Encounter for general adult medical examination without abnormal findings: Secondary | ICD-10-CM | POA: Diagnosis not present

## 2018-12-12 DIAGNOSIS — Z0000 Encounter for general adult medical examination without abnormal findings: Secondary | ICD-10-CM | POA: Diagnosis not present

## 2018-12-12 DIAGNOSIS — Z6824 Body mass index (BMI) 24.0-24.9, adult: Secondary | ICD-10-CM | POA: Diagnosis not present

## 2018-12-12 DIAGNOSIS — Z1211 Encounter for screening for malignant neoplasm of colon: Secondary | ICD-10-CM | POA: Diagnosis not present

## 2018-12-27 ENCOUNTER — Other Ambulatory Visit: Payer: Self-pay | Admitting: Family Medicine

## 2018-12-27 DIAGNOSIS — R5381 Other malaise: Secondary | ICD-10-CM

## 2018-12-28 ENCOUNTER — Other Ambulatory Visit: Payer: Self-pay | Admitting: Family Medicine

## 2018-12-28 DIAGNOSIS — E2839 Other primary ovarian failure: Secondary | ICD-10-CM

## 2019-02-25 ENCOUNTER — Ambulatory Visit: Payer: 59 | Attending: Internal Medicine

## 2019-02-25 ENCOUNTER — Ambulatory Visit: Payer: Self-pay

## 2019-02-25 DIAGNOSIS — Z23 Encounter for immunization: Secondary | ICD-10-CM | POA: Insufficient documentation

## 2019-02-25 NOTE — Progress Notes (Signed)
   Covid-19 Vaccination Clinic  Name:  Kathleen Ball    MRN: [id number] DOB: 1950-08-11  02/25/2019  Kathleen Ball was observed post Covid-19 immunization for 15 minutes without incidence. She was provided with Vaccine Information Sheet and instruction to access the V-Safe system.   Kathleen Ball was instructed to call 911 with any severe reactions post vaccine: Marland Kitchen Difficulty breathing  . Swelling of your face and throat  . A fast heartbeat  . A bad rash all over your body  . Dizziness and weakness    Immunizations Administered    Name Date Dose VIS Date Route   Pfizer COVID-19 Vaccine 02/25/2019 10:28 AM 0.3 mL 12/29/2018 Intramuscular   Manufacturer: [employer], [employer]   Lot: [id number]   NDC: [address]-1

## 2019-03-17 ENCOUNTER — Ambulatory Visit: Payer: Self-pay

## 2019-03-21 ENCOUNTER — Ambulatory Visit: Payer: 59 | Attending: Internal Medicine

## 2019-03-21 DIAGNOSIS — Z23 Encounter for immunization: Secondary | ICD-10-CM | POA: Insufficient documentation

## 2019-03-21 NOTE — Progress Notes (Signed)
   Covid-19 Vaccination Clinic  Name:  Kathleen Ball. Kathleen Ball    MRN: [id number] DOB: 10-19-1950  03/21/2019  Kathleen Ball was observed post Covid-19 immunization for 30 minutes based on pre-vaccination screening without incident. She was provided with Vaccine Information Sheet and instruction to access the V-Safe system.   Kathleen Ball was instructed to call 911 with any severe reactions post vaccine: Marland Kitchen Difficulty breathing  . Swelling of face and throat  . A fast heartbeat  . A bad rash all over body  . Dizziness and weakness   Immunizations Administered    Name Date Dose VIS Date Route   Pfizer COVID-19 Vaccine 03/21/2019 10:25 AM 0.3 mL 12/29/2018 Intramuscular   Manufacturer: [employer], [employer]   Lot: [id number]   NDC: [address]-2

## 2019-12-11 DIAGNOSIS — Z1159 Encounter for screening for other viral diseases: Secondary | ICD-10-CM | POA: Diagnosis not present

## 2019-12-11 DIAGNOSIS — Z0000 Encounter for general adult medical examination without abnormal findings: Secondary | ICD-10-CM | POA: Diagnosis not present

## 2019-12-25 ENCOUNTER — Other Ambulatory Visit: Payer: Self-pay | Admitting: Family Medicine

## 2019-12-25 DIAGNOSIS — Z1231 Encounter for screening mammogram for malignant neoplasm of breast: Secondary | ICD-10-CM

## 2019-12-25 DIAGNOSIS — Z13820 Encounter for screening for osteoporosis: Secondary | ICD-10-CM

## 2020-09-18 DIAGNOSIS — H5200 Hypermetropia, unspecified eye: Secondary | ICD-10-CM | POA: Diagnosis not present

## 2020-09-18 DIAGNOSIS — Z0100 Encounter for examination of eyes and vision without abnormal findings: Secondary | ICD-10-CM | POA: Diagnosis not present

## 2021-04-28 DIAGNOSIS — I10 Essential (primary) hypertension: Secondary | ICD-10-CM | POA: Diagnosis not present

## 2021-04-28 DIAGNOSIS — E782 Mixed hyperlipidemia: Secondary | ICD-10-CM | POA: Diagnosis not present

## 2021-04-28 DIAGNOSIS — R739 Hyperglycemia, unspecified: Secondary | ICD-10-CM | POA: Diagnosis not present

## 2021-05-04 DIAGNOSIS — E782 Mixed hyperlipidemia: Secondary | ICD-10-CM | POA: Diagnosis not present

## 2021-05-04 DIAGNOSIS — R7303 Prediabetes: Secondary | ICD-10-CM | POA: Diagnosis not present

## 2021-05-04 DIAGNOSIS — J309 Allergic rhinitis, unspecified: Secondary | ICD-10-CM | POA: Diagnosis not present

## 2021-05-04 DIAGNOSIS — I10 Essential (primary) hypertension: Secondary | ICD-10-CM | POA: Diagnosis not present

## 2021-07-30 DIAGNOSIS — E559 Vitamin D deficiency, unspecified: Secondary | ICD-10-CM | POA: Diagnosis not present

## 2021-07-30 DIAGNOSIS — I10 Essential (primary) hypertension: Secondary | ICD-10-CM | POA: Diagnosis not present

## 2021-07-30 DIAGNOSIS — E782 Mixed hyperlipidemia: Secondary | ICD-10-CM | POA: Diagnosis not present

## 2021-07-30 DIAGNOSIS — R7303 Prediabetes: Secondary | ICD-10-CM | POA: Diagnosis not present

## 2021-08-04 DIAGNOSIS — E782 Mixed hyperlipidemia: Secondary | ICD-10-CM | POA: Diagnosis not present

## 2021-08-04 DIAGNOSIS — E559 Vitamin D deficiency, unspecified: Secondary | ICD-10-CM | POA: Diagnosis not present

## 2021-08-04 DIAGNOSIS — R7303 Prediabetes: Secondary | ICD-10-CM | POA: Diagnosis not present

## 2021-08-11 ENCOUNTER — Other Ambulatory Visit: Payer: Self-pay | Admitting: Family Medicine

## 2021-08-11 DIAGNOSIS — E2839 Other primary ovarian failure: Secondary | ICD-10-CM

## 2021-08-11 DIAGNOSIS — Z1231 Encounter for screening mammogram for malignant neoplasm of breast: Secondary | ICD-10-CM

## 2021-09-23 DIAGNOSIS — Z1389 Encounter for screening for other disorder: Secondary | ICD-10-CM | POA: Diagnosis not present

## 2021-09-23 DIAGNOSIS — Z23 Encounter for immunization: Secondary | ICD-10-CM | POA: Diagnosis not present

## 2021-09-23 DIAGNOSIS — Z1331 Encounter for screening for depression: Secondary | ICD-10-CM | POA: Diagnosis not present

## 2021-09-23 DIAGNOSIS — Z1211 Encounter for screening for malignant neoplasm of colon: Secondary | ICD-10-CM | POA: Diagnosis not present

## 2021-09-23 DIAGNOSIS — Z0000 Encounter for general adult medical examination without abnormal findings: Secondary | ICD-10-CM | POA: Diagnosis not present

## 2021-10-01 ENCOUNTER — Ambulatory Visit
Admission: RE | Admit: 2021-10-01 | Discharge: 2021-10-01 | Disposition: A | Discharge status: Home / Self Care | Payer: Medicare Other | Source: Ambulatory Visit | Attending: Family Medicine | Admitting: Family Medicine

## 2021-10-01 DIAGNOSIS — M85832 Other specified disorders of bone density and structure, left forearm: Secondary | ICD-10-CM | POA: Diagnosis not present

## 2021-10-01 DIAGNOSIS — E2839 Other primary ovarian failure: Secondary | ICD-10-CM

## 2021-10-01 DIAGNOSIS — Z780 Asymptomatic menopausal state: Secondary | ICD-10-CM | POA: Diagnosis not present

## 2021-11-04 DIAGNOSIS — H5203 Hypermetropia, bilateral: Secondary | ICD-10-CM | POA: Diagnosis not present

## 2021-11-04 DIAGNOSIS — H2513 Age-related nuclear cataract, bilateral: Secondary | ICD-10-CM | POA: Diagnosis not present

## 2021-11-10 DIAGNOSIS — E559 Vitamin D deficiency, unspecified: Secondary | ICD-10-CM | POA: Diagnosis not present

## 2021-11-10 DIAGNOSIS — I10 Essential (primary) hypertension: Secondary | ICD-10-CM | POA: Diagnosis not present

## 2021-11-10 DIAGNOSIS — R7303 Prediabetes: Secondary | ICD-10-CM | POA: Diagnosis not present

## 2021-11-12 DIAGNOSIS — Z7185 Encounter for immunization safety counseling: Secondary | ICD-10-CM | POA: Diagnosis not present

## 2021-11-12 DIAGNOSIS — I10 Essential (primary) hypertension: Secondary | ICD-10-CM | POA: Diagnosis not present

## 2021-11-12 DIAGNOSIS — M8580 Other specified disorders of bone density and structure, unspecified site: Secondary | ICD-10-CM | POA: Diagnosis not present

## 2021-11-12 DIAGNOSIS — J309 Allergic rhinitis, unspecified: Secondary | ICD-10-CM | POA: Diagnosis not present

## 2021-11-12 DIAGNOSIS — R7303 Prediabetes: Secondary | ICD-10-CM | POA: Diagnosis not present

## 2021-12-17 DIAGNOSIS — Z23 Encounter for immunization: Secondary | ICD-10-CM | POA: Diagnosis not present

## 2022-02-25 DIAGNOSIS — I10 Essential (primary) hypertension: Secondary | ICD-10-CM | POA: Diagnosis not present

## 2022-02-25 DIAGNOSIS — E782 Mixed hyperlipidemia: Secondary | ICD-10-CM | POA: Diagnosis not present

## 2022-02-25 DIAGNOSIS — E559 Vitamin D deficiency, unspecified: Secondary | ICD-10-CM | POA: Diagnosis not present

## 2022-02-25 DIAGNOSIS — Z114 Encounter for screening for human immunodeficiency virus [HIV]: Secondary | ICD-10-CM | POA: Diagnosis not present

## 2022-03-02 DIAGNOSIS — Z0000 Encounter for general adult medical examination without abnormal findings: Secondary | ICD-10-CM | POA: Diagnosis not present

## 2022-03-02 DIAGNOSIS — Z1211 Encounter for screening for malignant neoplasm of colon: Secondary | ICD-10-CM | POA: Diagnosis not present

## 2022-03-02 DIAGNOSIS — I10 Essential (primary) hypertension: Secondary | ICD-10-CM | POA: Diagnosis not present

## 2022-03-02 DIAGNOSIS — E782 Mixed hyperlipidemia: Secondary | ICD-10-CM | POA: Diagnosis not present

## 2022-03-02 DIAGNOSIS — E559 Vitamin D deficiency, unspecified: Secondary | ICD-10-CM | POA: Diagnosis not present

## 2022-09-07 DIAGNOSIS — E559 Vitamin D deficiency, unspecified: Secondary | ICD-10-CM | POA: Diagnosis not present

## 2022-09-07 DIAGNOSIS — E785 Hyperlipidemia, unspecified: Secondary | ICD-10-CM | POA: Diagnosis not present

## 2022-09-07 DIAGNOSIS — R5383 Other fatigue: Secondary | ICD-10-CM | POA: Diagnosis not present

## 2022-09-28 DIAGNOSIS — E782 Mixed hyperlipidemia: Secondary | ICD-10-CM | POA: Diagnosis not present

## 2022-09-28 DIAGNOSIS — E559 Vitamin D deficiency, unspecified: Secondary | ICD-10-CM | POA: Diagnosis not present

## 2022-09-28 DIAGNOSIS — Z7185 Encounter for immunization safety counseling: Secondary | ICD-10-CM | POA: Diagnosis not present

## 2022-09-28 DIAGNOSIS — G720 Drug-induced myopathy: Secondary | ICD-10-CM | POA: Diagnosis not present

## 2022-09-28 DIAGNOSIS — J309 Allergic rhinitis, unspecified: Secondary | ICD-10-CM | POA: Diagnosis not present

## 2022-09-28 DIAGNOSIS — Z789 Other specified health status: Secondary | ICD-10-CM | POA: Diagnosis not present

## 2022-09-28 DIAGNOSIS — I10 Essential (primary) hypertension: Secondary | ICD-10-CM | POA: Diagnosis not present

## 2022-09-29 DIAGNOSIS — Z1339 Encounter for screening examination for other mental health and behavioral disorders: Secondary | ICD-10-CM | POA: Diagnosis not present

## 2022-09-29 DIAGNOSIS — Z1331 Encounter for screening for depression: Secondary | ICD-10-CM | POA: Diagnosis not present

## 2022-09-29 DIAGNOSIS — Z0000 Encounter for general adult medical examination without abnormal findings: Secondary | ICD-10-CM | POA: Diagnosis not present

## 2022-09-30 DIAGNOSIS — Z23 Encounter for immunization: Secondary | ICD-10-CM | POA: Diagnosis not present

## 2022-11-03 DIAGNOSIS — Z23 Encounter for immunization: Secondary | ICD-10-CM | POA: Diagnosis not present

## 2023-02-16 DIAGNOSIS — I10 Essential (primary) hypertension: Secondary | ICD-10-CM | POA: Diagnosis not present

## 2023-02-16 DIAGNOSIS — U071 COVID-19: Secondary | ICD-10-CM | POA: Diagnosis not present

## 2023-02-21 DIAGNOSIS — N3000 Acute cystitis without hematuria: Secondary | ICD-10-CM | POA: Diagnosis not present

## 2023-02-21 DIAGNOSIS — N390 Urinary tract infection, site not specified: Secondary | ICD-10-CM | POA: Diagnosis not present

## 2023-02-28 DIAGNOSIS — Z13220 Encounter for screening for lipoid disorders: Secondary | ICD-10-CM | POA: Diagnosis not present

## 2023-02-28 DIAGNOSIS — Z0000 Encounter for general adult medical examination without abnormal findings: Secondary | ICD-10-CM | POA: Diagnosis not present

## 2023-03-07 ENCOUNTER — Other Ambulatory Visit: Payer: Self-pay | Admitting: Family Medicine

## 2023-03-07 DIAGNOSIS — E559 Vitamin D deficiency, unspecified: Secondary | ICD-10-CM

## 2023-03-07 DIAGNOSIS — Z0000 Encounter for general adult medical examination without abnormal findings: Secondary | ICD-10-CM | POA: Diagnosis not present

## 2023-05-03 DIAGNOSIS — H524 Presbyopia: Secondary | ICD-10-CM | POA: Diagnosis not present

## 2023-05-05 DIAGNOSIS — N814 Uterovaginal prolapse, unspecified: Secondary | ICD-10-CM | POA: Diagnosis not present

## 2023-08-23 DIAGNOSIS — E559 Vitamin D deficiency, unspecified: Secondary | ICD-10-CM | POA: Diagnosis not present

## 2023-08-23 DIAGNOSIS — R5383 Other fatigue: Secondary | ICD-10-CM | POA: Diagnosis not present

## 2023-08-23 DIAGNOSIS — E785 Hyperlipidemia, unspecified: Secondary | ICD-10-CM | POA: Diagnosis not present

## 2023-09-05 DIAGNOSIS — Z1339 Encounter for screening examination for other mental health and behavioral disorders: Secondary | ICD-10-CM | POA: Diagnosis not present

## 2023-09-05 DIAGNOSIS — I10 Essential (primary) hypertension: Secondary | ICD-10-CM | POA: Diagnosis not present

## 2023-09-05 DIAGNOSIS — Z0000 Encounter for general adult medical examination without abnormal findings: Secondary | ICD-10-CM | POA: Diagnosis not present

## 2023-09-05 DIAGNOSIS — E782 Mixed hyperlipidemia: Secondary | ICD-10-CM | POA: Diagnosis not present

## 2023-09-05 DIAGNOSIS — E559 Vitamin D deficiency, unspecified: Secondary | ICD-10-CM | POA: Diagnosis not present

## 2023-09-05 DIAGNOSIS — Z1331 Encounter for screening for depression: Secondary | ICD-10-CM | POA: Diagnosis not present

## 2023-10-14 DIAGNOSIS — M85852 Other specified disorders of bone density and structure, left thigh: Secondary | ICD-10-CM | POA: Diagnosis not present

## 2023-10-20 DIAGNOSIS — M8580 Other specified disorders of bone density and structure, unspecified site: Secondary | ICD-10-CM | POA: Diagnosis not present

## 2023-10-20 DIAGNOSIS — I10 Essential (primary) hypertension: Secondary | ICD-10-CM | POA: Diagnosis not present

## 2023-10-20 DIAGNOSIS — Z23 Encounter for immunization: Secondary | ICD-10-CM | POA: Diagnosis not present
